# Patient Record
Sex: Female | Born: 1975 | Hispanic: No | Marital: Married | Smoking: Never smoker
Health system: Southern US, Community
[De-identification: ages and names within clinical notes are randomized; demographics above are authoritative.]

## PROBLEM LIST (undated history)

## (undated) DIAGNOSIS — Z789 Other specified health status: Secondary | ICD-10-CM

---

## 2004-04-10 HISTORY — PX: MYOMECTOMY ABDOMINAL APPROACH: SUR870

## 2005-10-27 ENCOUNTER — Other Ambulatory Visit: Admission: RE | Admit: 2005-10-27 | Discharge: 2005-10-27 | Payer: Self-pay | Admitting: Obstetrics and Gynecology

## 2007-01-19 ENCOUNTER — Other Ambulatory Visit: Admission: RE | Admit: 2007-01-19 | Discharge: 2007-01-19 | Payer: Self-pay | Admitting: Obstetrics and Gynecology

## 2009-01-04 ENCOUNTER — Other Ambulatory Visit: Admission: RE | Admit: 2009-01-04 | Discharge: 2009-01-04 | Payer: Self-pay | Admitting: Obstetrics and Gynecology

## 2010-10-23 ENCOUNTER — Other Ambulatory Visit: Payer: Self-pay | Admitting: Family Medicine

## 2010-10-23 DIAGNOSIS — Z1231 Encounter for screening mammogram for malignant neoplasm of breast: Secondary | ICD-10-CM

## 2010-10-25 ENCOUNTER — Ambulatory Visit
Admission: RE | Admit: 2010-10-25 | Discharge: 2010-10-25 | Disposition: A | Discharge status: Home / Self Care | Payer: 59 | Source: Ambulatory Visit | Attending: Family Medicine | Admitting: Family Medicine

## 2010-10-25 DIAGNOSIS — Z1231 Encounter for screening mammogram for malignant neoplasm of breast: Secondary | ICD-10-CM

## 2011-02-20 ENCOUNTER — Other Ambulatory Visit (HOSPITAL_COMMUNITY)
Admission: RE | Admit: 2011-02-20 | Discharge: 2011-02-20 | Disposition: A | Discharge status: Home / Self Care | Payer: 59 | Source: Ambulatory Visit | Attending: Obstetrics and Gynecology | Admitting: Obstetrics and Gynecology

## 2011-02-20 DIAGNOSIS — Z1159 Encounter for screening for other viral diseases: Secondary | ICD-10-CM | POA: Insufficient documentation

## 2011-02-20 DIAGNOSIS — Z01419 Encounter for gynecological examination (general) (routine) without abnormal findings: Secondary | ICD-10-CM | POA: Insufficient documentation

## 2011-06-13 LAB — OB RESULTS CONSOLE ANTIBODY SCREEN: Antibody Screen: NEGATIVE

## 2011-06-13 LAB — OB RESULTS CONSOLE RUBELLA ANTIBODY, IGM: Rubella: IMMUNE

## 2011-12-31 ENCOUNTER — Inpatient Hospital Stay (HOSPITAL_COMMUNITY)
Admission: AD | Admit: 2011-12-31 | Discharge: 2012-01-03 | DRG: 765 | Disposition: A | Discharge status: Home / Self Care | Payer: 59 | Source: Ambulatory Visit | Attending: Obstetrics and Gynecology | Admitting: Obstetrics and Gynecology

## 2011-12-31 ENCOUNTER — Encounter (HOSPITAL_COMMUNITY): Payer: Self-pay | Admitting: *Deleted

## 2011-12-31 ENCOUNTER — Encounter (HOSPITAL_COMMUNITY)
Admission: AD | Disposition: A | Discharge status: Home / Self Care | Payer: Self-pay | Source: Ambulatory Visit | Attending: Obstetrics and Gynecology

## 2011-12-31 ENCOUNTER — Inpatient Hospital Stay (HOSPITAL_COMMUNITY): Payer: 59 | Admitting: Anesthesiology

## 2011-12-31 ENCOUNTER — Encounter (HOSPITAL_COMMUNITY): Payer: Self-pay | Admitting: Anesthesiology

## 2011-12-31 DIAGNOSIS — Z98891 History of uterine scar from previous surgery: Secondary | ICD-10-CM

## 2011-12-31 DIAGNOSIS — O3490 Maternal care for abnormality of pelvic organ, unspecified, unspecified trimester: Principal | ICD-10-CM | POA: Diagnosis present

## 2011-12-31 DIAGNOSIS — O09529 Supervision of elderly multigravida, unspecified trimester: Secondary | ICD-10-CM | POA: Diagnosis present

## 2011-12-31 HISTORY — DX: Other specified health status: Z78.9

## 2011-12-31 LAB — CBC
HCT: 37.3 % (ref 36.0–46.0)
Hemoglobin: 12.9 g/dL (ref 12.0–15.0)
WBC: 11.3 10*3/uL — ABNORMAL HIGH (ref 4.0–10.5)

## 2011-12-31 SURGERY — Surgical Case
Anesthesia: Spinal | Site: Abdomen | Wound class: Clean Contaminated

## 2011-12-31 MED ORDER — DIPHENHYDRAMINE HCL 50 MG/ML IJ SOLN: 25.0000 mg | INTRAMUSCULAR | Status: DC | PRN

## 2011-12-31 MED ORDER — ONDANSETRON HCL 4 MG/2ML IJ SOLN
INTRAMUSCULAR | Status: AC
  Filled 2011-12-31: qty 2

## 2011-12-31 MED ORDER — ZOLPIDEM TARTRATE 5 MG PO TABS: 5.0000 mg | ORAL_TABLET | Freq: Every evening | ORAL | Status: DC | PRN

## 2011-12-31 MED ORDER — MORPHINE SULFATE 0.5 MG/ML IJ SOLN
INTRAMUSCULAR | Status: AC
  Filled 2011-12-31: qty 10

## 2011-12-31 MED ORDER — DIPHENHYDRAMINE HCL 50 MG/ML IJ SOLN
INTRAMUSCULAR | Status: AC
  Filled 2011-12-31: qty 1

## 2011-12-31 MED ORDER — NALBUPHINE HCL 10 MG/ML IJ SOLN
5.0000 mg | INTRAMUSCULAR | Status: DC | PRN
  Filled 2011-12-31 (×2): qty 1

## 2011-12-31 MED ORDER — OXYTOCIN 10 UNIT/ML IJ SOLN
INTRAMUSCULAR | Status: AC
  Filled 2011-12-31: qty 4

## 2011-12-31 MED ORDER — SIMETHICONE 80 MG PO CHEW
80.0000 mg | CHEWABLE_TABLET | Freq: Three times a day (TID) | ORAL | Status: DC
  Administered 2012-01-01 – 2012-01-03 (×6): 80 mg via ORAL

## 2011-12-31 MED ORDER — DIPHENHYDRAMINE HCL 50 MG/ML IJ SOLN
12.5000 mg | INTRAMUSCULAR | Status: DC | PRN
  Administered 2011-12-31: 12.5 mg via INTRAVENOUS

## 2011-12-31 MED ORDER — KETOROLAC TROMETHAMINE 30 MG/ML IJ SOLN: 30.0000 mg | Freq: Four times a day (QID) | INTRAMUSCULAR | Status: AC | PRN

## 2011-12-31 MED ORDER — IBUPROFEN 600 MG PO TABS
600.0000 mg | ORAL_TABLET | Freq: Four times a day (QID) | ORAL | Status: DC
  Administered 2012-01-01 – 2012-01-03 (×10): 600 mg via ORAL
  Filled 2011-12-31 (×10): qty 1

## 2011-12-31 MED ORDER — METHYLERGONOVINE MALEATE 0.2 MG PO TABS: 0.2000 mg | ORAL_TABLET | ORAL | Status: DC | PRN

## 2011-12-31 MED ORDER — NALOXONE HCL 0.4 MG/ML IJ SOLN: 0.4000 mg | INTRAMUSCULAR | Status: DC | PRN

## 2011-12-31 MED ORDER — PHENYLEPHRINE HCL 10 MG/ML IJ SOLN
INTRAMUSCULAR | Status: DC | PRN
  Administered 2011-12-31: 120 ug via INTRAVENOUS
  Administered 2011-12-31 (×2): 40 ug via INTRAVENOUS
  Administered 2011-12-31: 80 ug via INTRAVENOUS

## 2011-12-31 MED ORDER — MIDAZOLAM HCL 2 MG/2ML IJ SOLN
INTRAMUSCULAR | Status: AC
  Filled 2011-12-31: qty 2

## 2011-12-31 MED ORDER — ONDANSETRON HCL 4 MG/2ML IJ SOLN: 4.0000 mg | Freq: Three times a day (TID) | INTRAMUSCULAR | Status: DC | PRN

## 2011-12-31 MED ORDER — IBUPROFEN 600 MG PO TABS: 600.0000 mg | ORAL_TABLET | Freq: Four times a day (QID) | ORAL | Status: DC | PRN

## 2011-12-31 MED ORDER — ONDANSETRON HCL 4 MG/2ML IJ SOLN: 4.0000 mg | INTRAMUSCULAR | Status: DC | PRN

## 2011-12-31 MED ORDER — PRENATAL MULTIVITAMIN CH: 1.0000 | ORAL_TABLET | Freq: Every day | ORAL | Status: DC

## 2011-12-31 MED ORDER — HYDROMORPHONE HCL PF 1 MG/ML IJ SOLN: 0.2500 mg | INTRAMUSCULAR | Status: DC | PRN

## 2011-12-31 MED ORDER — PHENYLEPHRINE 40 MCG/ML (10ML) SYRINGE FOR IV PUSH (FOR BLOOD PRESSURE SUPPORT)
PREFILLED_SYRINGE | INTRAVENOUS | Status: AC
  Filled 2011-12-31: qty 10

## 2011-12-31 MED ORDER — CITRIC ACID-SODIUM CITRATE 334-500 MG/5ML PO SOLN
30.0000 mL | Freq: Once | ORAL | Status: AC
  Administered 2011-12-31: 30 mL via ORAL
  Filled 2011-12-31: qty 15

## 2011-12-31 MED ORDER — CEFAZOLIN SODIUM-DEXTROSE 2-3 GM-% IV SOLR
2.0000 g | INTRAVENOUS | Status: AC
  Administered 2011-12-31: 2 g via INTRAVENOUS
  Filled 2011-12-31: qty 50

## 2011-12-31 MED ORDER — MEPERIDINE HCL 25 MG/ML IJ SOLN: 6.2500 mg | INTRAMUSCULAR | Status: DC | PRN

## 2011-12-31 MED ORDER — SODIUM CHLORIDE 0.9 % IJ SOLN: 3.0000 mL | INTRAMUSCULAR | Status: DC | PRN

## 2011-12-31 MED ORDER — OXYTOCIN 40 UNITS IN LACTATED RINGERS INFUSION - SIMPLE MED: 62.5000 mL/h | INTRAVENOUS | Status: AC

## 2011-12-31 MED ORDER — ONDANSETRON HCL 4 MG PO TABS: 4.0000 mg | ORAL_TABLET | ORAL | Status: DC | PRN

## 2011-12-31 MED ORDER — FENTANYL CITRATE 0.05 MG/ML IJ SOLN
INTRAMUSCULAR | Status: DC | PRN
  Administered 2011-12-31: 25 ug via INTRATHECAL

## 2011-12-31 MED ORDER — METOCLOPRAMIDE HCL 5 MG/ML IJ SOLN
10.0000 mg | Freq: Three times a day (TID) | INTRAMUSCULAR | Status: DC | PRN
  Administered 2011-12-31: 10 mg via INTRAVENOUS

## 2011-12-31 MED ORDER — SCOPOLAMINE 1 MG/3DAYS TD PT72
1.0000 | MEDICATED_PATCH | Freq: Once | TRANSDERMAL | Status: DC
  Administered 2011-12-31: 1.5 mg via TRANSDERMAL

## 2011-12-31 MED ORDER — LACTATED RINGERS IV SOLN
INTRAVENOUS | Status: DC
  Administered 2011-12-31 (×3): via INTRAVENOUS

## 2011-12-31 MED ORDER — FAMOTIDINE IN NACL 20-0.9 MG/50ML-% IV SOLN
20.0000 mg | Freq: Once | INTRAVENOUS | Status: AC
  Administered 2011-12-31: 20 mg via INTRAVENOUS
  Filled 2011-12-31: qty 50

## 2011-12-31 MED ORDER — PRENATAL MULTIVITAMIN CH
1.0000 | ORAL_TABLET | Freq: Every morning | ORAL | Status: DC
  Administered 2012-01-01 – 2012-01-03 (×3): 1 via ORAL
  Filled 2011-12-31 (×3): qty 1

## 2011-12-31 MED ORDER — SIMETHICONE 80 MG PO CHEW: 80.0000 mg | CHEWABLE_TABLET | ORAL | Status: DC | PRN

## 2011-12-31 MED ORDER — SODIUM CHLORIDE 0.9 % IV SOLN: 1.0000 ug/kg/h | INTRAVENOUS | Status: DC | PRN

## 2011-12-31 MED ORDER — WITCH HAZEL-GLYCERIN EX PADS: 1.0000 "application " | MEDICATED_PAD | CUTANEOUS | Status: DC | PRN

## 2011-12-31 MED ORDER — KETOROLAC TROMETHAMINE 60 MG/2ML IM SOLN
INTRAMUSCULAR | Status: AC
  Filled 2011-12-31: qty 2

## 2011-12-31 MED ORDER — SENNOSIDES-DOCUSATE SODIUM 8.6-50 MG PO TABS
2.0000 | ORAL_TABLET | Freq: Every day | ORAL | Status: DC
  Administered 2012-01-01: 2 via ORAL

## 2011-12-31 MED ORDER — MENTHOL 3 MG MT LOZG: 1.0000 | LOZENGE | OROMUCOSAL | Status: DC | PRN

## 2011-12-31 MED ORDER — NALBUPHINE HCL 10 MG/ML IJ SOLN
5.0000 mg | INTRAMUSCULAR | Status: DC | PRN
  Administered 2011-12-31: 5 mg via INTRAVENOUS
  Administered 2012-01-01: 10 mg via INTRAVENOUS
  Filled 2011-12-31: qty 1

## 2011-12-31 MED ORDER — OXYCODONE-ACETAMINOPHEN 5-325 MG PO TABS
1.0000 | ORAL_TABLET | ORAL | Status: DC | PRN
  Administered 2012-01-01: 1 via ORAL
  Filled 2011-12-31: qty 1

## 2011-12-31 MED ORDER — METOCLOPRAMIDE HCL 5 MG/ML IJ SOLN
INTRAMUSCULAR | Status: AC
  Filled 2011-12-31: qty 2

## 2011-12-31 MED ORDER — DIBUCAINE 1 % RE OINT: 1.0000 "application " | TOPICAL_OINTMENT | RECTAL | Status: DC | PRN

## 2011-12-31 MED ORDER — LANOLIN HYDROUS EX OINT: 1.0000 "application " | TOPICAL_OINTMENT | CUTANEOUS | Status: DC | PRN

## 2011-12-31 MED ORDER — MORPHINE SULFATE (PF) 0.5 MG/ML IJ SOLN
INTRAMUSCULAR | Status: DC | PRN
  Administered 2011-12-31: .15 ug via INTRATHECAL

## 2011-12-31 MED ORDER — DIPHENHYDRAMINE HCL 25 MG PO CAPS
25.0000 mg | ORAL_CAPSULE | ORAL | Status: DC | PRN
  Filled 2011-12-31: qty 1

## 2011-12-31 MED ORDER — SCOPOLAMINE 1 MG/3DAYS TD PT72
MEDICATED_PATCH | TRANSDERMAL | Status: AC
  Filled 2011-12-31: qty 1

## 2011-12-31 MED ORDER — ONDANSETRON HCL 4 MG/2ML IJ SOLN
INTRAMUSCULAR | Status: DC | PRN
  Administered 2011-12-31: 4 mg via INTRAVENOUS

## 2011-12-31 MED ORDER — METHYLERGONOVINE MALEATE 0.2 MG/ML IJ SOLN: 0.2000 mg | INTRAMUSCULAR | Status: DC | PRN

## 2011-12-31 MED ORDER — NALBUPHINE SYRINGE 5 MG/0.5 ML
INJECTION | INTRAMUSCULAR | Status: AC
  Administered 2012-01-01: 10 mg via INTRAVENOUS
  Filled 2011-12-31: qty 0.5

## 2011-12-31 MED ORDER — FENTANYL CITRATE 0.05 MG/ML IJ SOLN
INTRAMUSCULAR | Status: AC
  Filled 2011-12-31: qty 4

## 2011-12-31 MED ORDER — DIPHENHYDRAMINE HCL 25 MG PO CAPS: 25.0000 mg | ORAL_CAPSULE | Freq: Four times a day (QID) | ORAL | Status: DC | PRN

## 2011-12-31 MED ORDER — LACTATED RINGERS IV SOLN
INTRAVENOUS | Status: DC
  Administered 2011-12-31: 23:00:00 via INTRAVENOUS

## 2011-12-31 MED ORDER — FERROUS SULFATE 325 (65 FE) MG PO TABS
325.0000 mg | ORAL_TABLET | Freq: Two times a day (BID) | ORAL | Status: DC
  Administered 2012-01-01 – 2012-01-03 (×5): 325 mg via ORAL
  Filled 2011-12-31 (×5): qty 1

## 2011-12-31 MED ORDER — KETOROLAC TROMETHAMINE 60 MG/2ML IM SOLN
60.0000 mg | Freq: Once | INTRAMUSCULAR | Status: AC | PRN
  Administered 2011-12-31: 60 mg via INTRAMUSCULAR

## 2011-12-31 SURGICAL SUPPLY — 34 items
APL SKNCLS STERI-STRIP NONHPOA (GAUZE/BANDAGES/DRESSINGS) ×1
BENZOIN TINCTURE PRP APPL 2/3 (GAUZE/BANDAGES/DRESSINGS) ×2 IMPLANT
CLOTH BEACON ORANGE TIMEOUT ST (SAFETY) ×2 IMPLANT
DRAPE SURG 17X23 STRL (DRAPES) ×2 IMPLANT
DRESSING TELFA 8X3 (GAUZE/BANDAGES/DRESSINGS) ×2 IMPLANT
DRSG COVADERM 4X10 (GAUZE/BANDAGES/DRESSINGS) IMPLANT
DURAPREP 26ML APPLICATOR (WOUND CARE) ×2 IMPLANT
ELECT REM PT RETURN 9FT ADLT (ELECTROSURGICAL) ×2
ELECTRODE REM PT RTRN 9FT ADLT (ELECTROSURGICAL) ×1 IMPLANT
GAUZE SPONGE 4X4 12PLY STRL LF (GAUZE/BANDAGES/DRESSINGS) ×2 IMPLANT
GLOVE BIOGEL M 6.5 STRL (GLOVE) ×4 IMPLANT
GLOVE BIOGEL PI IND STRL 6.5 (GLOVE) ×2 IMPLANT
GLOVE BIOGEL PI INDICATOR 6.5 (GLOVE) ×2
GOWN PREVENTION PLUS LG XLONG (DISPOSABLE) ×4 IMPLANT
GOWN PREVENTION PLUS XLARGE (GOWN DISPOSABLE) ×2 IMPLANT
NS IRRIG 1000ML POUR BTL (IV SOLUTION) ×2 IMPLANT
PACK C SECTION WH (CUSTOM PROCEDURE TRAY) ×2 IMPLANT
PAD ABD 7.5X8 STRL (GAUZE/BANDAGES/DRESSINGS) ×2 IMPLANT
PAD OB MATERNITY 4.3X12.25 (PERSONAL CARE ITEMS) IMPLANT
RTRCTR C-SECT PINK 25CM LRG (MISCELLANEOUS) ×2 IMPLANT
SLEEVE SCD COMPRESS KNEE MED (MISCELLANEOUS) ×2 IMPLANT
SUT PDS AB 0 CT1 27 (SUTURE) ×4 IMPLANT
SUT PLAIN 0 NONE (SUTURE) IMPLANT
SUT VIC AB 0 CTX 36 (SUTURE) ×8
SUT VIC AB 0 CTX36XBRD ANBCTRL (SUTURE) ×4 IMPLANT
SUT VIC AB 2-0 CT1 27 (SUTURE) ×4
SUT VIC AB 2-0 CT1 TAPERPNT 27 (SUTURE) ×2 IMPLANT
SUT VIC AB 3-0 SH 27 (SUTURE)
SUT VIC AB 3-0 SH 27X BRD (SUTURE) IMPLANT
SUT VIC AB 4-0 KS 27 (SUTURE) ×4 IMPLANT
TAPE CLOTH SURG 4X10 WHT LF (GAUZE/BANDAGES/DRESSINGS) ×2 IMPLANT
TOWEL OR 17X24 6PK STRL BLUE (TOWEL DISPOSABLE) ×4 IMPLANT
TRAY FOLEY CATH 14FR (SET/KITS/TRAYS/PACK) ×2 IMPLANT
WATER STERILE IRR 1000ML POUR (IV SOLUTION) ×2 IMPLANT

## 2011-12-31 NOTE — Anesthesia Postprocedure Evaluation (Signed)
Anesthesia Post Note  Patient: Andrea Patel  Procedure(s) Performed: Procedure(s) (LRB): CESAREAN SECTION (N/A)  Anesthesia type: Spinal  Patient location: PACU  Post pain: Pain level controlled  Post assessment: Post-op Vital signs reviewed  Last Vitals:  Filed Vitals:   12/31/11 2000  BP: 87/60  Pulse: 75  Temp:   Resp: 23    Post vital signs: Reviewed  Level of consciousness: awake  Complications: No apparent anesthesia complications

## 2011-12-31 NOTE — OR Nursing (Signed)
Placenta to OR Refrigerator. 

## 2011-12-31 NOTE — Anesthesia Procedure Notes (Signed)

## 2011-12-31 NOTE — Anesthesia Preprocedure Evaluation (Addendum)
Anesthesia Evaluation  Patient identified by MRN, date of birth, ID band Patient awake    Reviewed: Allergy & Precautions, H&P , Patient's Chart, lab work & pertinent test results  Airway Mallampati: II TM Distance: >3 FB Neck ROM: full    Dental No notable dental hx.    Pulmonary  breath sounds clear to auscultation  Pulmonary exam normal       Cardiovascular Exercise Tolerance: Good Rhythm:regular Rate:Normal     Neuro/Psych    GI/Hepatic GERD-  ,  Endo/Other    Renal/GU      Musculoskeletal   Abdominal   Peds  Hematology   Anesthesia Other Findings   Reproductive/Obstetrics                           Anesthesia Physical Anesthesia Plan  ASA: II  Anesthesia Plan: Spinal   Post-op Pain Management:    Induction:   Airway Management Planned:   Additional Equipment:   Intra-op Plan:   Post-operative Plan:   Informed Consent: I have reviewed the patients History and Physical, chart, labs and discussed the procedure including the risks, benefits and alternatives for the proposed anesthesia with the patient or authorized representative who has indicated his/her understanding and acceptance.   Dental Advisory Given  Plan Discussed with: CRNA  Anesthesia Plan Comments: (Lab work confirmed with CRNA in room. Platelets okay. Discussed spinal anesthetic, and patient consents to the procedure:  included risk of possible headache,backache, failed block, allergic reaction, and nerve injury. This patient was asked if she had any questions or concerns before the procedure started. )        Anesthesia Quick Evaluation  

## 2011-12-31 NOTE — Progress Notes (Signed)
To OR via stretcher

## 2011-12-31 NOTE — H&P (Signed)
Andrea Patel is a 36 y.o. female presenting at 36 wks and 5 days  Based on lmp 04/18/2011 with EDD 01/23/2012. Pregnancy is complicated by h/o preterm delivery  At 5 and 6 days  And h/o laparoscopic myomectomy. Pt reports rupture of clear fluid at 130 this afternoon with onset of contraction q 3-5 minutes. + FM no vaginal bleeding   History OB History    Grav Para Term Preterm Abortions TAB SAB Ect Mult Living   3 2  2      1      Past Medical History  Diagnosis Date  . No pertinent past medical history    Past Surgical History  Procedure Date  . Myomectomy abdominal approach 04/2004   Family History: family history is negative for Other. Social History:  reports that she has never smoked. She does not have any smokeless tobacco history on file. She reports that she does not drink alcohol or use illicit drugs.   Prenatal Transfer Tool  Maternal Diabetes: No Genetic Screening: Normal Maternal Ultrasounds/Referrals: Normal Fetal Ultrasounds or other Referrals:  None Maternal Substance Abuse:  No Significant Maternal Medications:  Meds include: Other:  17 hydroxy progesterone shots  Significant Maternal Lab Results:  Lab values include: Group B Strep negative Other Comments:  None  Review of Systems  All other systems reviewed and are negative.    Dilation: 4 Exam by:: Dr Richardson Dopp Blood pressure 124/82, pulse 98, temperature 98.7 F (37.1 C), temperature source Oral, resp. rate 20, height 5\' 5"  (1.651 m), weight 80.74 kg (178 lb), last menstrual period 04/15/2011, unknown if currently breastfeeding. Maternal Exam:  Uterine Assessment: Contraction strength is moderate.  Contraction duration is 60 seconds. Contraction frequency is regular.   Abdomen: Patient reports no abdominal tenderness. Surgical scars: laparoscopic scar.   Fundal height is 36.   Estimated fetal weight is 5 lbs .   Fetal presentation: vertex  Introitus: Normal vulva. Normal vagina.  Ferning test:  positive.  Nitrazine test: positive. Amniotic fluid character: clear.  Pelvis: adequate for delivery.   Cervix: Cervix evaluated by digital exam.     Fetal Exam Fetal Monitor Review: Mode: fetoscope.   Baseline rate: 140.  Variability: moderate (6-25 bpm).   Pattern: accelerations present and no decelerations.    Fetal State Assessment: Category I - tracings are normal.     Physical Exam  Constitutional: She is oriented to person, place, and time. She appears well-developed and well-nourished.  Neck: Normal range of motion.  Cardiovascular: Normal rate and regular rhythm.   Respiratory: Effort normal and breath sounds normal.  Genitourinary: Vagina normal.  Neurological: She is alert and oriented to person, place, and time.  Psychiatric: She has a normal mood and affect.    Prenatal labs: ABO, Rh:  A positive   Antibody:  Negative  Rubella:  Immune   RPR:   Non reactive  HBsAg:   Negative  HIV:   Non reactive  GBS:   Negative   Assessment/Plan: 36 wks and 5 days with h/o myomectomy in active labor with prom. D/w pt r/o cesarean section vs VBAC. Pt desires to proceed with cesarean section. Risk discussed including but not limited to infection bleeding damage to bowel bladder and baby with the need for further surgery. R/o transfusion HIV Hep B&C discussed. Pt voiced understanding and desires to proceed with cesarean section. She excepts r/o transfusion if needed    Tmya Patel J. 12/31/2011, 5:39 PM

## 2011-12-31 NOTE — Progress Notes (Signed)
Dr Richardson Dopp discussing  C/S vs svd with pt. And spouse. Pt will have C/S

## 2011-12-31 NOTE — Progress Notes (Signed)
To OR

## 2011-12-31 NOTE — Transfer of Care (Signed)
Immediate Anesthesia Transfer of Care Note  Patient: Andrea Patel  Procedure(s) Performed: Procedure(s) (LRB) with comments: CESAREAN SECTION (N/A)  Patient Location: PACU  Anesthesia Type: Spinal  Level of Consciousness: awake, alert  and oriented  Airway & Oxygen Therapy: Patient Spontanous Breathing  Post-op Assessment: Report given to PACU RN  Post vital signs: Reviewed and stable  Complications: No apparent anesthesia complications

## 2011-12-31 NOTE — MAU Note (Signed)
Went to [facility] about 1500 and urinated. Went back to work and felt a gush of fld. Cont. To leak cl fld. Having some contractions

## 2011-12-31 NOTE — Op Note (Signed)
Cesarean Section Procedure Note  Indications: previous myomectomy  Pre-operative Diagnosis: 36 week 5 day pregnancy.  Post-operative Diagnosis: same  Surgeon: Jessee Avers. M.D.  Assistants: Dr. Geryl Rankins   Anesthesia: Spinal anesthesia  ASA Class: 2   Procedure Details   The patient was seen in the Holding Room. The risks, benefits, complications, treatment options, and expected outcomes were discussed with the patient.  The patient concurred with the proposed plan, giving informed consent.  The site of surgery properly noted/marked. The patient was taken to Operating Room # 1, identified as Physicist, medical and the procedure verified as C-Section Delivery. A Time Out was held and the above information confirmed.  After induction of anesthesia, the patient was draped and prepped in the usual sterile manner. A Pfannenstiel incision was made and carried down through the subcutaneous tissue to the fascia. Fascial incision was made and extended transversely. The fascia was separated from the underlying rectus tissue superiorly and inferiorly. The peritoneum was identified and entered. Peritoneal incision was extended longitudinally. The utero-vesical peritoneal reflection was incised transversely and the bladder flap was bluntly freed from the lower uterine segment. A low transverse uterine incision was made. Delivered from cephalic presentation was a Female with Apgar scores of 9 at one minute and 9 at five minutes. After the umbilical cord was clamped and cut cord blood was obtained for evaluation. The placenta was removed intact and appeared normal. The uterine outline, tubes and ovaries appeared normal. The uterine incision was closed with running locked sutures of 0 vicryl a second layer of 0 vicryl was used for imbrication . Hemostasis was observed. Lavage was carried out until clear.  Peritoneum was reapproximated with 2-0 vicryl The fascia was then reapproximated with running sutures  of 0 pds. The skin was reapproximated with 4-0 vicryl .  Instrument, sponge, and needle counts were correct prior the abdominal closure and at the conclusion of the case.   Findings: Female infant in cephalic presentation   Estimated Blood Loss:  700 ml    UOP 200 ml          Drains: Foley catheter          Total IV Fluids:  Per anesthesia          Specimens: Placenta and  to labor and delivery            Implants: none         Complications:  None; patient tolerated the procedure well.         Disposition: PACU - hemodynamically stable.         Condition: stable  Attending Attestation: I performed the procedure.

## 2012-01-01 ENCOUNTER — Encounter (HOSPITAL_COMMUNITY): Payer: Self-pay | Admitting: Anesthesiology

## 2012-01-01 ENCOUNTER — Encounter (HOSPITAL_COMMUNITY): Payer: Self-pay | Admitting: *Deleted

## 2012-01-01 LAB — CBC
HCT: 32.3 % — ABNORMAL LOW (ref 36.0–46.0)
MCV: 74.1 fL — ABNORMAL LOW (ref 78.0–100.0)
RBC: 4.36 MIL/uL (ref 3.87–5.11)
WBC: 17.8 10*3/uL — ABNORMAL HIGH (ref 4.0–10.5)

## 2012-01-01 NOTE — Progress Notes (Signed)
Subjective: Postpartum Day 1: Cesarean Delivery Patient reports pain is well controlled.  Bleeding WNL.  +Breast feeding, doing well.     Objective: Vital signs in last 24 hours: Temp:  [97.7 F (36.5 C)-99.8 F (37.7 C)] 98 F (36.7 C) (10/24 0800) Pulse Rate:  [68-98] 74  (10/24 0800) Resp:  [15-24] 18  (10/24 0800) BP: (81-127)/(47-82) 108/59 mmHg (10/24 0800) SpO2:  [94 %-100 %] 98 % (10/24 0800) Weight:  [80.74 kg (178 lb)] 80.74 kg (178 lb) (10/23 1648)  Physical Exam:  General: alert, cooperative and no distress Lochia: Not assessed at perineum. Uterine Fundus: firm, normally tender Incision: Dressing clean and dry. DVT Evaluation: No evidence of DVT seen on physical exam. SCDs on.   Basename 01/01/12 0535 12/31/11 1747  HGB 10.8* 12.9  HCT 32.3* 37.3    Assessment/Plan: Status post Cesarean section. Doing well postoperatively.  Continue current care. Lactation support. Encouraged ambulation in halls TID. Circ to be done by Dr. Richardson Dopp. Kylie Gros 01/01/2012, 2:10 PM

## 2012-01-01 NOTE — Anesthesia Postprocedure Evaluation (Signed)
  Anesthesia Post-op Note  Patient: Andrea Patel  Procedure(s) Performed: Procedure(s) (LRB) with comments: CESAREAN SECTION WITH BILATERAL TUBAL LIGATION (Bilateral) - Primary  Patient Location: PACU and Mother/Baby  Anesthesia Type: Spinal  Level of Consciousness: awake, alert  and oriented  Airway and Oxygen Therapy: Patient Spontanous Breathing  Post-op Pain: mild  Post-op Assessment: Patient's Cardiovascular Status Stable, Respiratory Function Stable, No signs of Nausea or vomiting and Pain level controlled  Post-op Vital Signs: stable  Complications: No apparent anesthesia complications

## 2012-01-02 MED ORDER — CALCIUM CARBONATE ANTACID 500 MG PO CHEW
1.0000 | CHEWABLE_TABLET | Freq: Three times a day (TID) | ORAL | Status: DC | PRN
  Administered 2012-01-02: 200 mg via ORAL
  Filled 2012-01-02: qty 1

## 2012-01-02 NOTE — Progress Notes (Signed)
Post Partum Day 2 s/p cesarean section due to h/o myomectomy  Subjective: no complaints, up ad lib, voiding and tolerating PO  Objective: Blood pressure 118/71, pulse 79, temperature 98.3 F (36.8 C), temperature source Oral, resp. rate 18, height 5\' 5"  (1.651 m), weight 80.74 kg (178 lb), last menstrual period 04/15/2011, SpO2 97.00%, unknown if currently breastfeeding.  Physical Exam:  General: alert and cooperative Lochia: appropriate Uterine Fundus: firm Incision: healing well DVT Evaluation: No evidence of DVT seen on physical exam.   Basename 01/01/12 0535 12/31/11 1747  HGB 10.8* 12.9  HCT 32.3* 37.3    Assessment/Plan: Plan for discharge tomorrow, Circumcision prior to discharge and Contraception  husband planning on vasectomy    LOS: 2 days   Andrea Patel. 01/02/2012, 8:11 AM

## 2012-01-03 ENCOUNTER — Encounter (HOSPITAL_COMMUNITY)
Admission: RE | Admit: 2012-01-03 | Discharge: 2012-01-03 | Disposition: A | Discharge status: Home / Self Care | Payer: 59 | Source: Ambulatory Visit | Attending: Obstetrics and Gynecology | Admitting: Obstetrics and Gynecology

## 2012-01-03 DIAGNOSIS — O923 Agalactia: Secondary | ICD-10-CM | POA: Insufficient documentation

## 2012-01-03 NOTE — Progress Notes (Signed)
Post Partum Day 3 s/p CS Subjective: no complaints, tolerating PO, + flatus and had bowel movement  Objective: Blood pressure 126/82, pulse 86, temperature 97.5 F (36.4 C), temperature source Oral, resp. rate 18, height 5\' 5"  (1.651 m), weight 80.74 kg (178 lb), last menstrual period 04/15/2011, SpO2 97.00%, unknown if currently breastfeeding.  Physical Exam:  General: alert, cooperative and no distress Lochia: appropriate Uterine Fundus: firm Episiotomy, laceration : na DVT Evaluation: No evidence of DVT seen on physical exam.   Basename 01/01/12 0535 12/31/11 1747  HGB 10.8* 12.9  HCT 32.3* 37.3    Assessment/Plan: Discharge home   LOS: 3 days   Kyrollos Cordell E 01/03/2012, 11:24 AM

## 2012-01-03 NOTE — Discharge Summary (Signed)
Obstetric Discharge Summary Reason for Admission: cesarean section Prenatal Procedures: none Intrapartum Procedures: cesarean: low cervical, transverse Postpartum Procedures: none Complications-Operative and Postpartum: none  Hemoglobin  Date Value Range Status  01/01/2012 10.8* 12.0 - 15.0 g/dL Final     HCT  Date Value Range Status  01/01/2012 32.3* 36.0 - 46.0 % Final    Discharge Diagnoses: Term Pregnancy-delivered  Discharge Information: Date: 01/03/2012 Activity: pelvic rest Diet: routine Medications: PNV and Ibuprofen (Motrin 800mg  po tid as needed)  rx to patient. Condition: stable Instructions: refer to practice specific booklet Discharge to: home Follow-up Information    Follow up with Jessee Avers., MD. Schedule an appointment as soon as possible for a visit in 2 weeks. (incision check )    Contact information:   [address] [place] [address] [phone]          Newborn Data: Live born  Information for the patient's newborn:  [name], [name] [[id number]]  female ; APGAR , ; weight ;  Home with mother.  Andrea Patel E 01/03/2012, 11:30 AM

## 2012-01-04 LAB — TYPE AND SCREEN
Antibody Screen: POSITIVE
Unit division: 0

## 2012-01-12 ENCOUNTER — Inpatient Hospital Stay (HOSPITAL_COMMUNITY): Admission: RE | Admit: 2012-01-12 | Payer: 59 | Source: Ambulatory Visit | Admitting: Obstetrics and Gynecology

## 2012-01-12 ENCOUNTER — Encounter (HOSPITAL_COMMUNITY): Admission: RE | Payer: Self-pay | Source: Ambulatory Visit

## 2012-01-12 SURGERY — Surgical Case
Anesthesia: Regional | Laterality: Bilateral

## 2012-02-03 ENCOUNTER — Encounter (HOSPITAL_COMMUNITY)
Admission: RE | Admit: 2012-02-03 | Discharge: 2012-02-03 | Disposition: A | Discharge status: Home / Self Care | Payer: 59 | Source: Ambulatory Visit | Attending: Obstetrics and Gynecology | Admitting: Obstetrics and Gynecology

## 2012-02-03 DIAGNOSIS — O923 Agalactia: Secondary | ICD-10-CM | POA: Insufficient documentation

## 2012-02-04 ENCOUNTER — Ambulatory Visit (HOSPITAL_COMMUNITY): Payer: 59

## 2012-02-10 ENCOUNTER — Ambulatory Visit (HOSPITAL_COMMUNITY)
Admission: RE | Admit: 2012-02-10 | Discharge: 2012-02-10 | Disposition: A | Discharge status: Home / Self Care | Payer: 59 | Source: Ambulatory Visit | Attending: Obstetrics and Gynecology | Admitting: Obstetrics and Gynecology

## 2012-02-10 NOTE — Progress Notes (Signed)
Adult Lactation Consultation Outpatient Visit Note  Patient Name: Andrea Patel                            Baby Boy Andrea Patel DOB [birth year] Now 27 weeks old, Birth Weight                                                                                       5 lb. 15 oz.  Date of Birth: [birth year] Gestational Age at Delivery: Unknown Type of Delivery: C/S  Breastfeeding History: Frequency of Breastfeeding: 3-4 times per day,  Length of Feeding: On average 15 minutes each breast, but sometimes 5 minutes per breast Voids: 6 per day Stools: 1 per day, yellow liquid  Supplementing / Method: Pumping:  Type of Pump:  [employer]   Frequency:  4-5 times/day, average for 20 minutes  Volume:  1 1/2 oz combined from both breasts  Comments: Mom is here for feeding assessment. She has been pumping and supplementing with EBM or formula since right after birth using bottle with slow flow nipple. About a week after delivery she developed a UTI and did not pump for that week. She has not been able to build up her milk supply. She has been drinking Mother's Milk Tea and Fenugreek Tea. She would like the baby to exclusively breastfeed, but reports he is fussy at the breast and at times will not maintain a latch.    Consultation Evaluation: On exam it is noted that Andrea Patel has a short anterior frenulum. He does bring his tongue forward just past the gum line. With suck exam he chews, then will develop a sucking rhythm for few sucks, then chews again. Mom has large breasts with a short nipple shaft. Mom latches Andrea Patel well in football and cross cradle, but it is noted that he will slip towards the end of her nipple and there is some compression noted on the nipple when you take him off the breast. When he slips on the nipple there is an audible clicking sound. Andrea Patel also seems frustrated with the flow, he pulls on and off the breast after suckling for a few minutes. Demonstrated to Mom how to compress  her aerola to obtain more depth with the latch. This does help but over time Tonita Cong needs adjusting due to him starting to pull on and off the breast. Initiated a SNS to supplement, after several attempts and adjustments, Tonita Cong did take approx 30 ml at the breast with the SNS. He became frustrated so Mom finished the feeding with a bottle and slow flow nipple. Mom used a nipple shield after Andrea Patel was born, but she stopped because she reports it was difficult to use. We did not initiate a nipple shield at this visit. Discussed with Mom the importance of a deep latch to milk transfer and milk supply. Encouraged to observe breasts with breastfeeding and when Andrea Patel appears to be slipping to re-latch. Discussed with Mom that the short frenulum could be contributing to [place] having difficulty maintaining depth with the latch. This along with her short nipple shafts. Discussed with Mom that  his bottle use may be contributing to his chewing motion at the breast and his frustration with the flow of milk from her breast.   Initial Feeding Assessment: Pre-feed Weight:   8 lb. 10.5 oz/ 3926 gm Post-feed Weight:  8 lb. 11.2 oz/3946 gm Amount Transferred:  20 ml Comments:  From left breast with nursing 17 minutes  Additional Feeding Assessment: Pre-feed Weight:  8 lb. 11.2 oz/ 3946 gm Post-feed Weight:  8 lb. 11.4 oz/ 3952 gm Amount Transferred:  6 ml Comments:  From right breast with nursing 11 min.  Additional Feeding Assessment: Pre-feed Weight:   8 lb. 11.4 oz/3952 gm Post-feed Weight:  8 lb. 13.7 oz/ 4016 gm Amount Transferred:  64 ml Comments:  Nursing on right breast with SNS and formula, baby took 30 ml of formula and transferred another 4 ml of breast milk. Then Mom gave Andrea Patel the remaining 30 ml of formula with bottle and slow flow nipple.  Total Breast milk Transferred this Visit: 30 ml Total Supplement Given: 60 ml.  Additional Interventions: Encouraged Mom to breast  feed more often during the day and evening before giving supplement, keep baby active at the breast for 15-20 minutes or more.  Use the SNS to supplement during the day, if it is easier use the bottle at night. When using the SNS, breast feed without the SNS on the 1st breast, use the SNS on the second breast with EBM or formula, 60-90 ml. Taught Mom how to perform suck training with the bottle to improve Baby Jeremiah's suck. Encouraged Mom to consider an evaluation by Speech therapist due to short frenulum and for evaluation of suck training. Information given to Mom to contact Speech therapist. Information given of supplements in increase milk production. Recommended Mother Milk Plus, or More Milk Special Blend from [facility]. Advised mom to post pump during the day for 15-20 minutes. Consider power pumping once in the am/pm.   Follow-Up  OP follow up scheduled for 02/19/12 at 9:00    Alfred Levins 02/10/2012, 2:06 PM

## 2012-02-19 ENCOUNTER — Ambulatory Visit (HOSPITAL_COMMUNITY)
Admission: RE | Admit: 2012-02-19 | Discharge: 2012-02-19 | Disposition: A | Discharge status: Home / Self Care | Payer: 59 | Source: Ambulatory Visit | Attending: Obstetrics and Gynecology | Admitting: Obstetrics and Gynecology

## 2012-02-19 NOTE — Progress Notes (Signed)
Adult Lactation Consultation Outpatient Visit Note  Patient Name: Andrea Patel(mother)   BABY: [name] Date of Birth: [birth year]                                      DOB: [birth year] Gestational Age at Delivery: 87.5                      BIRTH WEIGHT: 5-15 Type of Delivery: C/S                                         WEIGHT TODAY: 9-11  Breastfeeding History: Frequency of Breastfeeding: 5 TIMES/24 HOURS Length of Feeding: 3-20 MIN Voids: QS  Stools: QS  Supplementing / Method: EBM/FORMULA  2 OZ EVERY 3 HOURS PER BOTTLE Pumping:  Type of Pump:SYMPHONY   Frequency:5 TIMES PER DAY  Volume:  30-45 MLS  Comments:    Consultation Evaluation: Mom and 80 week old baby here for follow up appointment to assess low milk supply and feeding difficulties.  Baby has gained 17 oz/9 days.  Mom states breastfeeding has not improved since last visit.  Baby has an appointment to see a [facility] speech pathologist after lactation appointment to assess suck ability and possible tight frenulum.  Baby latched on easily in office and mom shown how to compress breast tissue to obtain deeper latch.  Baby did a little chewing and pulling from breast but for the majority of a 30 minute feed sucked with rhythmic pattern and few audible swallows.  Encouraged mom to use good breast massage/compressions during feeding to increase milk flow.  Baby also latched to other breast without difficulty for 10 minutes.  24 mm nipple shield placed towards the end of feeding to see if baby would do less pulling but not significant improvement noted.  Mom is not interested in using the SNS at this time.  She is taking mothers love herbal supplement.  Patient has no history of depression and may call her OB MD to discuss reglan for milk supply.  Baby only transferred 16 mls at his feeding.  Initial Feeding Assessment:20 minutes left/10 minutes right Pre-feed [id number] Post-feed Weight:4410 Amount Transferred:16  mls Comments:  Additional Feeding Assessment: Pre-feed Weight: Post-feed Weight: Amount Transferred: Comments:  Additional Feeding Assessment: Pre-feed Weight: Post-feed Weight: Amount Transferred: Comments:  Total Breast milk Transferred this Visit: 16 mls Total Supplement Given: 2 oz formula  Additional Interventions:   Follow-Up  Mom will call [facility] office for concerns or desired follow up appointment      Hansel Feinstein 02/19/2012, 3:12 PM

## 2012-03-04 ENCOUNTER — Encounter (HOSPITAL_COMMUNITY)
Admission: RE | Admit: 2012-03-04 | Discharge: 2012-03-04 | Disposition: A | Discharge status: Home / Self Care | Payer: 59 | Source: Ambulatory Visit | Attending: Obstetrics and Gynecology | Admitting: Obstetrics and Gynecology

## 2012-03-04 DIAGNOSIS — O923 Agalactia: Secondary | ICD-10-CM | POA: Insufficient documentation

## 2012-04-04 ENCOUNTER — Encounter (HOSPITAL_COMMUNITY)
Admission: RE | Admit: 2012-04-04 | Discharge: 2012-04-04 | Disposition: A | Discharge status: Home / Self Care | Payer: 59 | Source: Ambulatory Visit | Attending: Obstetrics and Gynecology | Admitting: Obstetrics and Gynecology

## 2012-04-04 DIAGNOSIS — O923 Agalactia: Secondary | ICD-10-CM | POA: Insufficient documentation

## 2012-05-05 ENCOUNTER — Encounter (HOSPITAL_COMMUNITY)
Admission: RE | Admit: 2012-05-05 | Discharge: 2012-05-05 | Disposition: A | Discharge status: Home / Self Care | Payer: 59 | Source: Ambulatory Visit | Attending: Obstetrics and Gynecology | Admitting: Obstetrics and Gynecology

## 2012-05-05 DIAGNOSIS — O923 Agalactia: Secondary | ICD-10-CM | POA: Insufficient documentation

## 2012-05-17 ENCOUNTER — Other Ambulatory Visit (HOSPITAL_COMMUNITY)
Admission: RE | Admit: 2012-05-17 | Discharge: 2012-05-17 | Disposition: A | Discharge status: Home / Self Care | Payer: 59 | Source: Ambulatory Visit | Attending: Obstetrics and Gynecology | Admitting: Obstetrics and Gynecology

## 2012-05-17 ENCOUNTER — Other Ambulatory Visit: Payer: Self-pay | Admitting: Obstetrics and Gynecology

## 2012-05-17 DIAGNOSIS — Z01419 Encounter for gynecological examination (general) (routine) without abnormal findings: Secondary | ICD-10-CM | POA: Insufficient documentation

## 2012-05-17 DIAGNOSIS — Z1151 Encounter for screening for human papillomavirus (HPV): Secondary | ICD-10-CM | POA: Insufficient documentation

## 2012-05-28 ENCOUNTER — Encounter (HOSPITAL_COMMUNITY): Admission: RE | Admit: 2012-05-28 | Payer: 59 | Source: Ambulatory Visit

## 2012-06-03 ENCOUNTER — Encounter (HOSPITAL_COMMUNITY)
Admission: RE | Admit: 2012-06-03 | Discharge: 2012-06-03 | Disposition: A | Discharge status: Home / Self Care | Payer: 59 | Source: Ambulatory Visit | Attending: Obstetrics and Gynecology | Admitting: Obstetrics and Gynecology

## 2012-06-03 DIAGNOSIS — O923 Agalactia: Secondary | ICD-10-CM | POA: Insufficient documentation

## 2012-07-04 ENCOUNTER — Encounter (HOSPITAL_COMMUNITY)
Admission: RE | Admit: 2012-07-04 | Discharge: 2012-07-04 | Disposition: A | Discharge status: Home / Self Care | Payer: 59 | Source: Ambulatory Visit | Attending: Obstetrics and Gynecology | Admitting: Obstetrics and Gynecology

## 2012-07-04 DIAGNOSIS — O923 Agalactia: Secondary | ICD-10-CM | POA: Insufficient documentation

## 2012-08-04 ENCOUNTER — Encounter (HOSPITAL_COMMUNITY): Payer: 59

## 2012-08-04 ENCOUNTER — Encounter (HOSPITAL_COMMUNITY)
Admission: RE | Admit: 2012-08-04 | Discharge: 2012-08-04 | Disposition: A | Discharge status: Home / Self Care | Payer: 59 | Source: Ambulatory Visit | Attending: Obstetrics and Gynecology | Admitting: Obstetrics and Gynecology

## 2012-08-04 DIAGNOSIS — O923 Agalactia: Secondary | ICD-10-CM | POA: Insufficient documentation

## 2013-10-24 ENCOUNTER — Other Ambulatory Visit: Payer: Self-pay

## 2013-10-24 DIAGNOSIS — Z1231 Encounter for screening mammogram for malignant neoplasm of breast: Secondary | ICD-10-CM

## 2013-10-31 ENCOUNTER — Encounter (INDEPENDENT_AMBULATORY_CARE_PROVIDER_SITE_OTHER): Payer: Self-pay

## 2013-10-31 ENCOUNTER — Ambulatory Visit
Admission: RE | Admit: 2013-10-31 | Discharge: 2013-10-31 | Disposition: A | Discharge status: Home / Self Care | Payer: 59 | Source: Ambulatory Visit

## 2013-10-31 DIAGNOSIS — Z1231 Encounter for screening mammogram for malignant neoplasm of breast: Secondary | ICD-10-CM

## 2014-01-09 ENCOUNTER — Encounter (HOSPITAL_COMMUNITY): Payer: Self-pay | Admitting: *Deleted

## 2015-01-09 ENCOUNTER — Other Ambulatory Visit: Payer: Self-pay

## 2015-01-09 DIAGNOSIS — Z1231 Encounter for screening mammogram for malignant neoplasm of breast: Secondary | ICD-10-CM

## 2015-02-12 ENCOUNTER — Ambulatory Visit: Payer: Self-pay

## 2015-03-26 ENCOUNTER — Ambulatory Visit
Admission: RE | Admit: 2015-03-26 | Discharge: 2015-03-26 | Disposition: A | Discharge status: Home / Self Care | Payer: 59 | Source: Ambulatory Visit

## 2015-03-26 DIAGNOSIS — Z1231 Encounter for screening mammogram for malignant neoplasm of breast: Secondary | ICD-10-CM

## 2015-07-12 ENCOUNTER — Other Ambulatory Visit (HOSPITAL_COMMUNITY)
Admission: RE | Admit: 2015-07-12 | Discharge: 2015-07-12 | Disposition: A | Discharge status: Home / Self Care | Payer: 59 | Source: Ambulatory Visit | Attending: Obstetrics and Gynecology | Admitting: Obstetrics and Gynecology

## 2015-07-12 ENCOUNTER — Other Ambulatory Visit: Payer: Self-pay | Admitting: Obstetrics and Gynecology

## 2015-07-12 DIAGNOSIS — Z01411 Encounter for gynecological examination (general) (routine) with abnormal findings: Secondary | ICD-10-CM | POA: Insufficient documentation

## 2015-07-12 DIAGNOSIS — Z1151 Encounter for screening for human papillomavirus (HPV): Secondary | ICD-10-CM | POA: Diagnosis present

## 2015-07-13 LAB — CYTOLOGY - PAP

## 2016-06-17 ENCOUNTER — Other Ambulatory Visit: Payer: Self-pay | Admitting: Family Medicine

## 2016-06-17 DIAGNOSIS — Z1231 Encounter for screening mammogram for malignant neoplasm of breast: Secondary | ICD-10-CM

## 2016-07-07 ENCOUNTER — Ambulatory Visit
Admission: RE | Admit: 2016-07-07 | Discharge: 2016-07-07 | Disposition: A | Discharge status: Home / Self Care | Payer: 59 | Source: Ambulatory Visit | Attending: Family Medicine | Admitting: Family Medicine

## 2016-07-07 DIAGNOSIS — Z1231 Encounter for screening mammogram for malignant neoplasm of breast: Secondary | ICD-10-CM

## 2017-03-02 ENCOUNTER — Emergency Department (HOSPITAL_BASED_OUTPATIENT_CLINIC_OR_DEPARTMENT_OTHER): Admit: 2017-03-02 | Discharge: 2017-03-02 | Disposition: A | Discharge status: Home / Self Care | Payer: 59

## 2017-03-02 ENCOUNTER — Emergency Department (HOSPITAL_COMMUNITY)
Admission: EM | Admit: 2017-03-02 | Discharge: 2017-03-02 | Disposition: A | Discharge status: Home / Self Care | Payer: 59 | Attending: Emergency Medicine | Admitting: Emergency Medicine

## 2017-03-02 ENCOUNTER — Other Ambulatory Visit: Payer: Self-pay

## 2017-03-02 ENCOUNTER — Emergency Department (HOSPITAL_COMMUNITY): Payer: 59

## 2017-03-02 ENCOUNTER — Encounter (HOSPITAL_COMMUNITY): Payer: Self-pay | Admitting: *Deleted

## 2017-03-02 DIAGNOSIS — Z79899 Other long term (current) drug therapy: Secondary | ICD-10-CM | POA: Insufficient documentation

## 2017-03-02 DIAGNOSIS — M25551 Pain in right hip: Secondary | ICD-10-CM

## 2017-03-02 DIAGNOSIS — M79609 Pain in unspecified limb: Secondary | ICD-10-CM | POA: Diagnosis not present

## 2017-03-02 MED ORDER — CYCLOBENZAPRINE HCL 10 MG PO TABS: 10.0000 mg | ORAL_TABLET | Freq: Two times a day (BID) | ORAL | 0 refills | Status: AC | PRN

## 2017-03-02 MED ORDER — CYCLOBENZAPRINE HCL 10 MG PO TABS
5.0000 mg | ORAL_TABLET | Freq: Once | ORAL | Status: AC
  Administered 2017-03-02: 5 mg via ORAL
  Filled 2017-03-02: qty 1

## 2017-03-02 NOTE — ED Triage Notes (Signed)
Pt in c/o R sided Hip pain onset today with pain radiating to R leg, pt denies injury, pt denies swelling,pt states, "I cannot walk because of the pain." pt denies bowel and bladder incontinence, A&O x4

## 2017-03-02 NOTE — ED Notes (Signed)
Patient transported to X-ray 

## 2017-03-02 NOTE — ED Provider Notes (Signed)
[facility]   CSN: [id number] Arrival date & time: 03/02/17  1519     History   Chief Complaint Chief Complaint  Patient presents with  . Leg Pain  . Hip Pain    HPI Andrea Patel is a 41 y.o. female no significant past medical history presents today with chief complaint acute onset, progressively worsening right hip pain which began earlier today.  Patient states that when she awoke this morning she felt that her hip "was catching ", and she began developing pain subsequently thereafter.  She states that pain became severe as the day went on, with some improvement after taking ibuprofen.  She notes throbbing pain while sitting, which worsens with changing positions and ambulation.  She states "I can hardly even walk now without assistance due to the pain ".  She also notes numbness to the anterior thigh extending to the knee.  She denies any recent trauma, falls, or bending, lifting, or twisting injury.  She denies numbness.  She denies back pain, bowel or bladder incontinence, fevers, chills, saddle anesthesia, or IV drug use.  She has no prior history of cancer.  No recent travel or surgeries, no hemoptysis or shortness of breath, no prior history of DVT or PE, and she is not on oral contraceptives or estrogen therapy.  The history is provided by the patient.    Past Medical History:  Diagnosis Date  . No pertinent past medical history     There are no active problems to display for this patient.   Past Surgical History:  Procedure Laterality Date  . CESAREAN SECTION  12/31/2011   Procedure: CESAREAN SECTION;  Surgeon: Dorien Chihuahuaara J. Richardson Doppole, MD;  Location: [facility];  Service: Obstetrics;  Laterality: N/A;  . MYOMECTOMY ABDOMINAL APPROACH  04/2004    OB History    Gravida Para Term Preterm AB Living   4 3   3   2    SAB TAB Ectopic Multiple Live Births           2       Home Medications    Prior to Admission medications    Medication Sig Start Date End Date Taking? Authorizing Provider  ibuprofen (ADVIL,MOTRIN) 200 MG tablet Take 800 mg by mouth every 6 (six) hours as needed for mild pain.   Yes [provider]  Multiple Vitamin (MULTIVITAMIN WITH MINERALS) TABS tablet Take 1 tablet by mouth daily.   Yes [provider]  cyclobenzaprine (FLEXERIL) 10 MG tablet Take 1 tablet (10 mg total) by mouth 2 (two) times daily as needed for muscle spasms. 03/02/17   Jeanie SewerFawze, Yessica Putnam A, PA-C    Family History Family History  Problem Relation Age of Onset  . Other Neg Hx     Social History Social History   Tobacco Use  . Smoking status: Never Smoker  . Smokeless tobacco: Never Used  Substance Use Topics  . Alcohol use: No  . Drug use: No     Allergies   Shellfish allergy   Review of Systems Review of Systems  Constitutional: Negative for chills and fever.  Musculoskeletal: Positive for arthralgias (Right hip) and gait problem. Negative for back pain, joint swelling and neck pain.  Neurological: Positive for numbness. Negative for syncope and weakness.     Physical Exam Updated Vital Signs BP 110/78 (BP Location: Right Arm)   Pulse 74   Temp 98.9 F (37.2 C) (Oral)   Resp 20   Ht  5\' 5"  (1.651 m)   Wt 74.8 kg (165 lb)   LMP 02/16/2017 (Approximate)   SpO2 99%   Breastfeeding? No   BMI 27.46 kg/m   Physical Exam  Constitutional: She appears well-developed and well-nourished. No distress.  HENT:  Head: Normocephalic and atraumatic.  Eyes: Conjunctivae are normal. Right eye exhibits no discharge. Left eye exhibits no discharge.  Neck: Normal range of motion. Neck supple. No JVD present. No tracheal deviation present.  Cardiovascular: Normal rate and intact distal pulses.  2+DP/PT pulses bl, negative Homan's bl, no lower extremity edema  Pulmonary/Chest: Effort normal and breath sounds normal.  Abdominal: Soft. Bowel sounds are normal. She exhibits no distension. There is no  tenderness.  Musculoskeletal: She exhibits tenderness. She exhibits no edema.  Severely decreased active and passive range of motion of the right hip secondary to pain, worst with internal rotation.  Mild tenderness to palpation anteriorly inferior to the ASIS.  No erythema, crepitus, or deformity noted.  5/5 strength of BLE major muscle groups, however with more difficulty in the left lower extremity. No midline spine TTP, no paraspinal muscle tenderness, no deformity, crepitus, or step-off noted.  Negative straight leg raise.  Neurological: She is alert.  Fluent speech, no facial droop, sensation intact to soft touch of bilateral lower extremity, antalgic gait, and patient is unable to ambulate without the assistance of myself or another person.  She has difficulty with Heel Walk and Toe Walk secondary to pain.  Skin: Skin is warm and dry. No erythema.  Psychiatric: She has a normal mood and affect. Her behavior is normal.  Nursing note and vitals reviewed.    ED Treatments / Results  Labs (all labs ordered are listed, but only abnormal results are displayed) Labs Reviewed - No data to display  EKG  EKG Interpretation None       Radiology Dg Hip Unilat With Pelvis 2-3 Views Right  Result Date: 03/02/2017 CLINICAL DATA:  Right hip pain beginning today with radiation down right leg. No injury. EXAM: DG HIP (WITH OR WITHOUT PELVIS) 2-3V RIGHT COMPARISON:  None. FINDINGS: There is no evidence of hip fracture or dislocation. There is no evidence of arthropathy or other focal bone abnormality. IMPRESSION: Negative. Electronically Signed   By: Elberta Fortisaniel  Boyle M.D.   On: 03/02/2017 17:28    Procedures Procedures (including critical care time)  Medications Ordered in ED Medications  cyclobenzaprine (FLEXERIL) tablet 5 mg (5 mg Oral Given 03/02/17 1915)     Initial Impression / Assessment and Plan / ED Course  I have reviewed the triage vital signs and the nursing notes.  Pertinent  labs & imaging results that were available during my care of the patient were reviewed by me and considered in my medical decision making (see chart for details).    Patient with acute onset of worsening right hip pain, improved with ibuprofen.  Afebrile, vital signs are stable (initially mildly hypertensive with improvement while in the ED).  She is ambulatory, however it is very painful.  She has decreased range of motion secondary to pain.  Radiographs without acute abnormality.  I doubt septic joint in the absence of constitutional symptoms, erythema, or swelling.  No evidence of gout or ostiomyelitis.  DVT study negative for DVT of the right lower extremity.  Pain has been managed while in the ED.  Suspect musculoskeletal etiology to her symptoms.  Will discharge with Flexeril and advised of appropriate use of this medication. RICE therapy indicated and discussed  with patient.  She will use a walker at home to avoid weightbearing.  She will follow-up with her PCP or orthopedist for reevaluation of her symptoms on an outpatient basis.  Discussed indications for return to the ED. Pt verbalized understanding of and agreement with plan and is safe for discharge home at this time.  She has no complaints prior to discharge.  Final Clinical Impressions(s) / ED Diagnoses   Final diagnoses:  Right hip pain    ED Discharge Orders        Ordered    cyclobenzaprine (FLEXERIL) 10 MG tablet  2 times daily PRN     03/02/17 1900       Jeanie Sewer, PA-C 03/02/17 2043    Charlynne Pander, MD 03/05/17 [phone]

## 2017-03-02 NOTE — ED Notes (Signed)
Pt staets pain has improved but took 800 mg ibuprofent PTA at 1430

## 2017-03-02 NOTE — Progress Notes (Signed)
Right lower extremity venous duplex has been completed. Negative for DVT. [facility].  03/02/17 6:24 PM Olen CordialGreg Aya Geisel RVT

## 2017-03-02 NOTE — ED Notes (Signed)
[place]. Home stable via wc with friend.

## 2017-03-02 NOTE — Discharge Instructions (Signed)
1. Medications: Alternate 600 mg of ibuprofen and [phone] mg of Tylenol every 3 hours as needed for pain. Do not exceed 4000 mg of Tylenol daily, Flexeril up to twice a day as needed for muscle spasm.  No driving, drinking alcohol, or operating heavy machinery on this medication as it may make you drowsy.  You may also cut these tablets in half.  Usual home medications 2. Treatment: rest, ice, elevate and use crutches or walker to avoid bearing weight, drink plenty of fluids, gentle stretching 3. Follow Up: Please followup with orthopedics as directed or your PCP in 1 week if no improvement for discussion of your diagnoses and further evaluation after today's visit; if you do not have a primary care doctor use the resource guide provided to find one; Please return to the ER for worsening symptoms or other concerns such as fever, weakness, back pain, or loss of control of bowel or bladder.

## 2017-08-04 ENCOUNTER — Ambulatory Visit
Admission: RE | Admit: 2017-08-04 | Discharge: 2017-08-04 | Disposition: A | Discharge status: Home / Self Care | Payer: 59 | Source: Ambulatory Visit | Attending: Family Medicine | Admitting: Family Medicine

## 2017-08-04 ENCOUNTER — Other Ambulatory Visit: Payer: Self-pay | Admitting: Family Medicine

## 2017-08-04 DIAGNOSIS — Z1231 Encounter for screening mammogram for malignant neoplasm of breast: Secondary | ICD-10-CM

## 2018-09-09 ENCOUNTER — Other Ambulatory Visit: Payer: Self-pay | Admitting: Family Medicine

## 2018-09-09 DIAGNOSIS — Z1231 Encounter for screening mammogram for malignant neoplasm of breast: Secondary | ICD-10-CM

## 2018-09-23 ENCOUNTER — Other Ambulatory Visit: Payer: Self-pay | Admitting: Obstetrics and Gynecology

## 2018-09-23 ENCOUNTER — Other Ambulatory Visit (HOSPITAL_COMMUNITY)
Admission: RE | Admit: 2018-09-23 | Discharge: 2018-09-23 | Disposition: A | Discharge status: Home / Self Care | Payer: 59 | Source: Ambulatory Visit | Attending: Obstetrics and Gynecology | Admitting: Obstetrics and Gynecology

## 2018-09-23 DIAGNOSIS — Z01419 Encounter for gynecological examination (general) (routine) without abnormal findings: Secondary | ICD-10-CM | POA: Diagnosis not present

## 2018-09-29 LAB — CYTOLOGY - PAP
Diagnosis: NEGATIVE
HPV: NOT DETECTED

## 2018-10-21 ENCOUNTER — Ambulatory Visit
Admission: RE | Admit: 2018-10-21 | Discharge: 2018-10-21 | Disposition: A | Discharge status: Home / Self Care | Payer: 59 | Source: Ambulatory Visit | Attending: Family Medicine | Admitting: Family Medicine

## 2018-10-21 ENCOUNTER — Other Ambulatory Visit: Payer: Self-pay

## 2018-10-21 DIAGNOSIS — Z1231 Encounter for screening mammogram for malignant neoplasm of breast: Secondary | ICD-10-CM

## 2019-01-13 ENCOUNTER — Other Ambulatory Visit: Payer: Self-pay

## 2019-01-13 DIAGNOSIS — Z20822 Contact with and (suspected) exposure to covid-19: Secondary | ICD-10-CM

## 2019-01-14 LAB — NOVEL CORONAVIRUS, NAA: SARS-CoV-2, NAA: NOT DETECTED

## 2019-10-11 ENCOUNTER — Other Ambulatory Visit: Payer: Self-pay | Admitting: Family Medicine

## 2019-10-11 DIAGNOSIS — Z1231 Encounter for screening mammogram for malignant neoplasm of breast: Secondary | ICD-10-CM

## 2019-10-31 ENCOUNTER — Other Ambulatory Visit: Payer: Self-pay

## 2019-10-31 ENCOUNTER — Ambulatory Visit
Admission: RE | Admit: 2019-10-31 | Discharge: 2019-10-31 | Disposition: A | Discharge status: Home / Self Care | Payer: 59 | Source: Ambulatory Visit | Attending: Family Medicine | Admitting: Family Medicine

## 2019-10-31 DIAGNOSIS — Z1231 Encounter for screening mammogram for malignant neoplasm of breast: Secondary | ICD-10-CM

## 2020-12-11 ENCOUNTER — Other Ambulatory Visit: Payer: Self-pay | Admitting: Family Medicine

## 2020-12-11 DIAGNOSIS — Z1231 Encounter for screening mammogram for malignant neoplasm of breast: Secondary | ICD-10-CM

## 2021-01-09 ENCOUNTER — Ambulatory Visit
Admission: RE | Admit: 2021-01-09 | Discharge: 2021-01-09 | Disposition: A | Discharge status: Home / Self Care | Payer: 59 | Source: Ambulatory Visit | Attending: Family Medicine | Admitting: Family Medicine

## 2021-01-09 ENCOUNTER — Other Ambulatory Visit: Payer: Self-pay

## 2021-01-09 DIAGNOSIS — Z1231 Encounter for screening mammogram for malignant neoplasm of breast: Secondary | ICD-10-CM

## 2021-11-20 DIAGNOSIS — N939 Abnormal uterine and vaginal bleeding, unspecified: Secondary | ICD-10-CM | POA: Diagnosis not present

## 2021-11-20 DIAGNOSIS — Z01419 Encounter for gynecological examination (general) (routine) without abnormal findings: Secondary | ICD-10-CM | POA: Diagnosis not present

## 2021-11-28 ENCOUNTER — Other Ambulatory Visit: Payer: Self-pay

## 2021-11-28 DIAGNOSIS — Z3202 Encounter for pregnancy test, result negative: Secondary | ICD-10-CM | POA: Diagnosis not present

## 2021-11-28 DIAGNOSIS — N939 Abnormal uterine and vaginal bleeding, unspecified: Secondary | ICD-10-CM | POA: Diagnosis not present

## 2021-11-29 DIAGNOSIS — Z1212 Encounter for screening for malignant neoplasm of rectum: Secondary | ICD-10-CM | POA: Diagnosis not present

## 2021-11-29 DIAGNOSIS — Z79899 Other long term (current) drug therapy: Secondary | ICD-10-CM | POA: Diagnosis not present

## 2021-11-29 DIAGNOSIS — Z1211 Encounter for screening for malignant neoplasm of colon: Secondary | ICD-10-CM | POA: Diagnosis not present

## 2021-11-29 DIAGNOSIS — D125 Benign neoplasm of sigmoid colon: Secondary | ICD-10-CM | POA: Diagnosis not present

## 2021-11-29 DIAGNOSIS — D122 Benign neoplasm of ascending colon: Secondary | ICD-10-CM | POA: Diagnosis not present

## 2021-12-02 DIAGNOSIS — R109 Unspecified abdominal pain: Secondary | ICD-10-CM | POA: Diagnosis not present

## 2021-12-13 DIAGNOSIS — K8020 Calculus of gallbladder without cholecystitis without obstruction: Secondary | ICD-10-CM | POA: Diagnosis not present

## 2021-12-13 DIAGNOSIS — R1011 Right upper quadrant pain: Secondary | ICD-10-CM | POA: Diagnosis not present

## 2021-12-13 DIAGNOSIS — Z9889 Other specified postprocedural states: Secondary | ICD-10-CM | POA: Diagnosis not present

## 2021-12-13 DIAGNOSIS — K810 Acute cholecystitis: Secondary | ICD-10-CM | POA: Diagnosis not present

## 2021-12-13 DIAGNOSIS — K8000 Calculus of gallbladder with acute cholecystitis without obstruction: Secondary | ICD-10-CM | POA: Diagnosis not present

## 2021-12-13 DIAGNOSIS — R110 Nausea: Secondary | ICD-10-CM | POA: Diagnosis not present

## 2021-12-13 DIAGNOSIS — K8010 Calculus of gallbladder with chronic cholecystitis without obstruction: Secondary | ICD-10-CM | POA: Diagnosis not present

## 2021-12-13 DIAGNOSIS — K8012 Calculus of gallbladder with acute and chronic cholecystitis without obstruction: Secondary | ICD-10-CM | POA: Diagnosis not present

## 2021-12-13 DIAGNOSIS — Z79899 Other long term (current) drug therapy: Secondary | ICD-10-CM | POA: Diagnosis not present

## 2021-12-30 ENCOUNTER — Other Ambulatory Visit: Payer: Self-pay | Admitting: Obstetrics and Gynecology

## 2021-12-30 DIAGNOSIS — Z1231 Encounter for screening mammogram for malignant neoplasm of breast: Secondary | ICD-10-CM

## 2022-01-23 ENCOUNTER — Ambulatory Visit: Payer: 59

## 2022-01-23 ENCOUNTER — Ambulatory Visit
Admission: RE | Admit: 2022-01-23 | Discharge: 2022-01-23 | Disposition: A | Discharge status: Home / Self Care | Payer: BC Managed Care – PPO | Source: Ambulatory Visit | Attending: Obstetrics and Gynecology | Admitting: Obstetrics and Gynecology

## 2022-01-23 DIAGNOSIS — Z1231 Encounter for screening mammogram for malignant neoplasm of breast: Secondary | ICD-10-CM

## 2022-05-12 DIAGNOSIS — M7732 Calcaneal spur, left foot: Secondary | ICD-10-CM | POA: Diagnosis not present

## 2022-05-12 DIAGNOSIS — M79672 Pain in left foot: Secondary | ICD-10-CM | POA: Diagnosis not present

## 2022-06-19 DIAGNOSIS — M79672 Pain in left foot: Secondary | ICD-10-CM | POA: Diagnosis not present

## 2022-06-19 DIAGNOSIS — L309 Dermatitis, unspecified: Secondary | ICD-10-CM | POA: Diagnosis not present

## 2022-06-19 DIAGNOSIS — Z0000 Encounter for general adult medical examination without abnormal findings: Secondary | ICD-10-CM | POA: Diagnosis not present

## 2022-06-19 DIAGNOSIS — Z1331 Encounter for screening for depression: Secondary | ICD-10-CM | POA: Diagnosis not present

## 2022-08-29 IMAGING — MG MM DIGITAL SCREENING BILAT W/ TOMO AND CAD
8 series · 8 of 24 positions shown · non-contrast
Comparison: Previous exam(s).

CLINICAL DATA: Screening.

EXAM:
DIGITAL SCREENING BILATERAL MAMMOGRAM WITH TOMOSYNTHESIS AND CAD
TECHNIQUE: Bilateral screening digital craniocaudal and mediolateral oblique
mammograms were obtained. Bilateral screening digital breast
tomosynthesis was performed. The images were evaluated with
computer-aided detection.

[L MLO synth-2D]
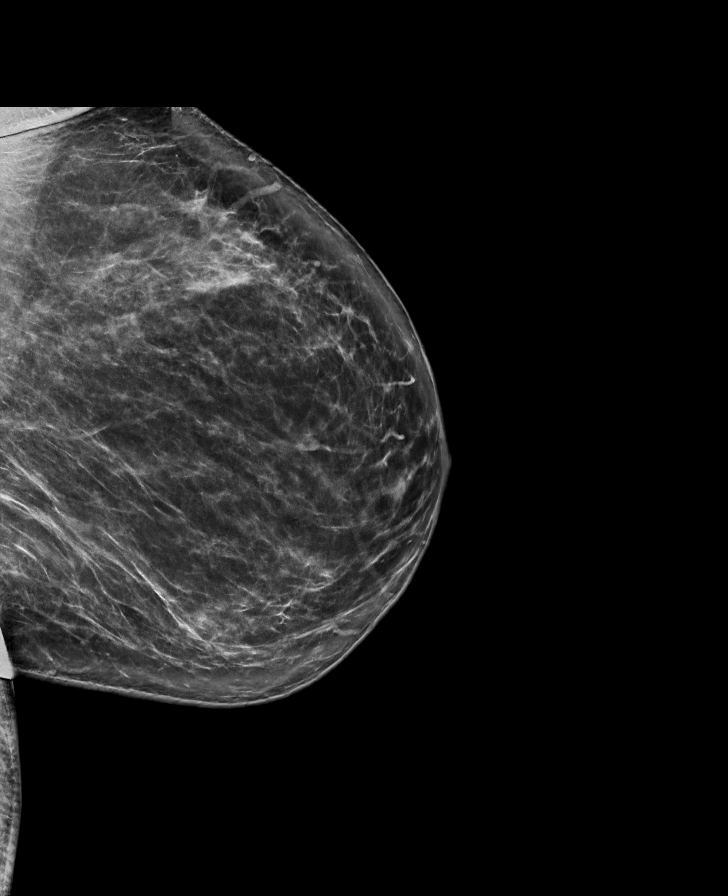

[R CC synth-2D]
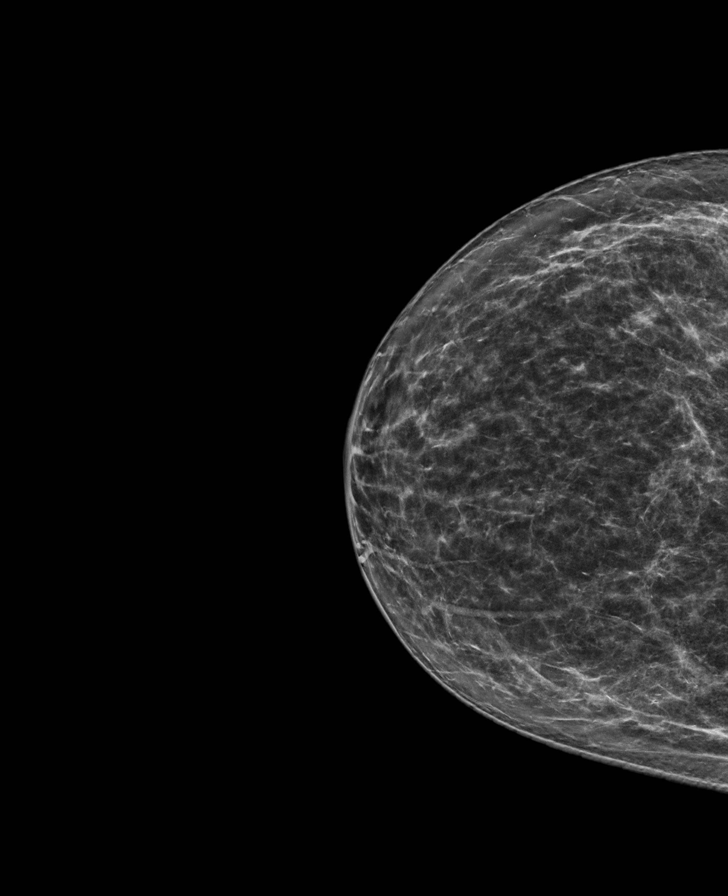

[L CC synth-2D]
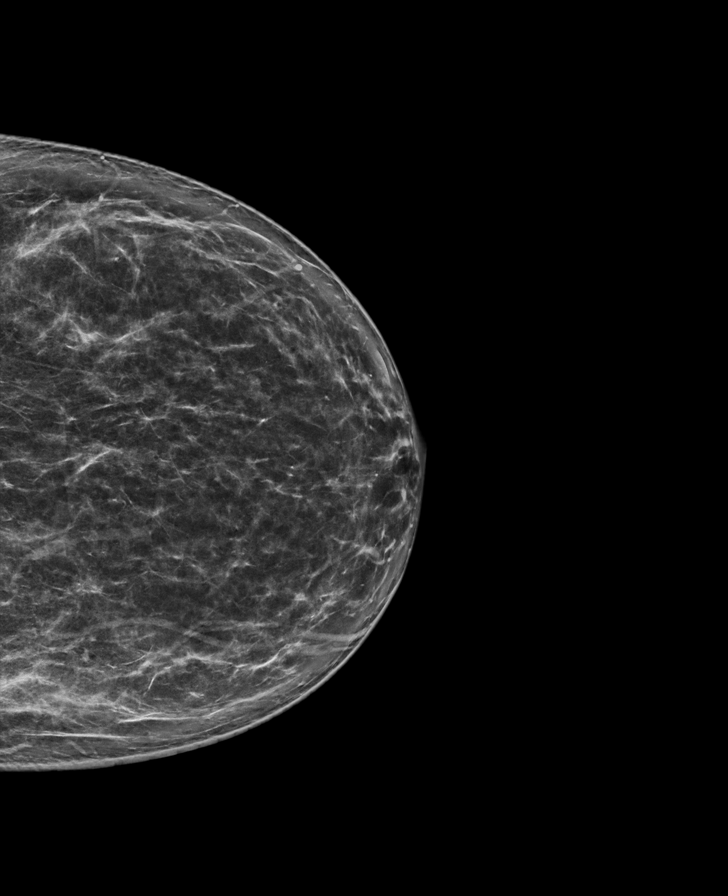

[R MLO synth-2D]
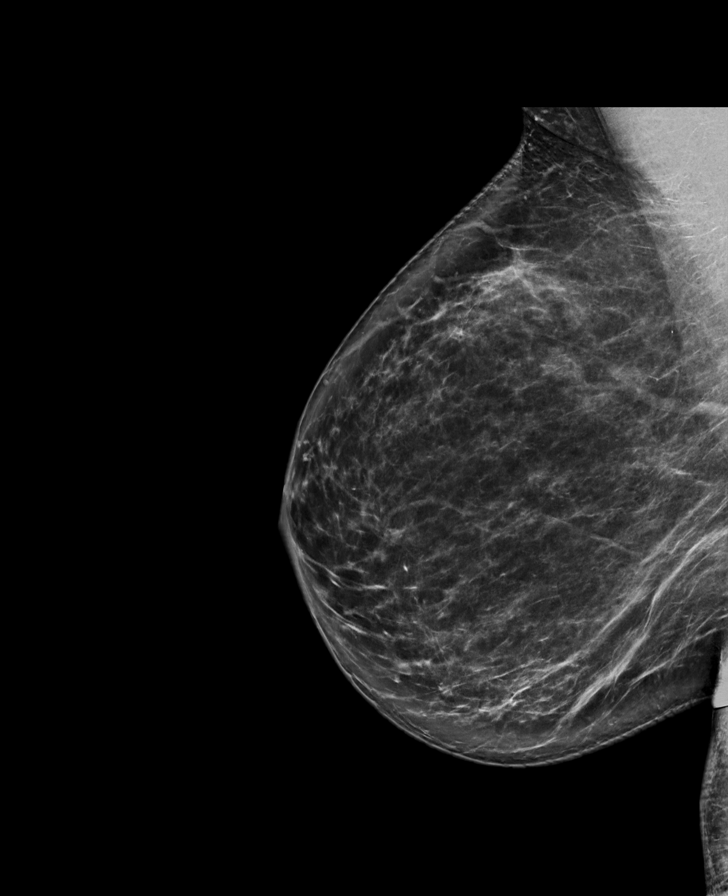

[R MLO tomo · tomo slice 48/95.0]
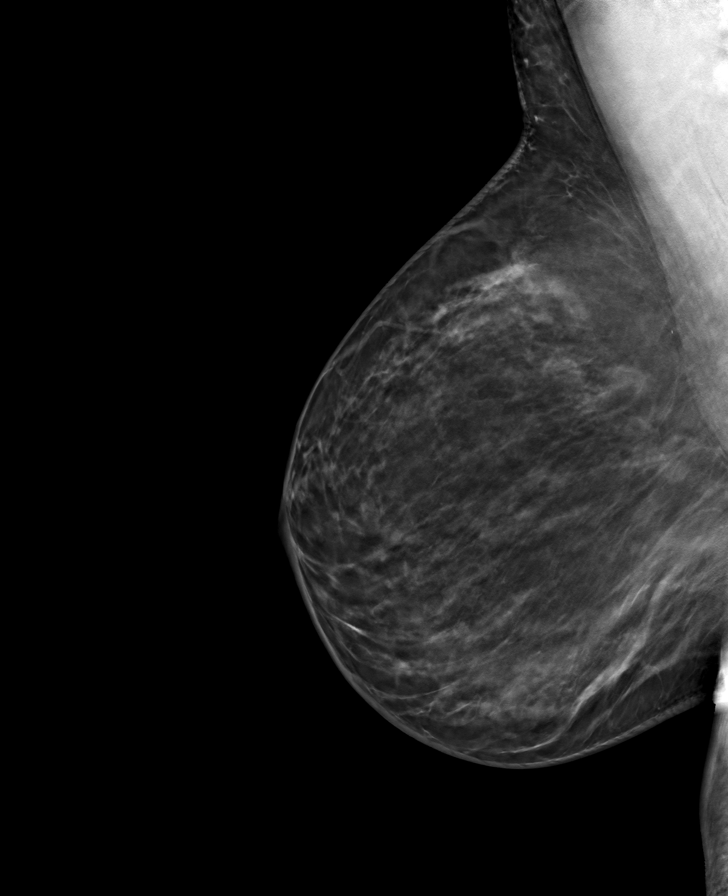

[L CC tomo · tomo slice 45/89.0]
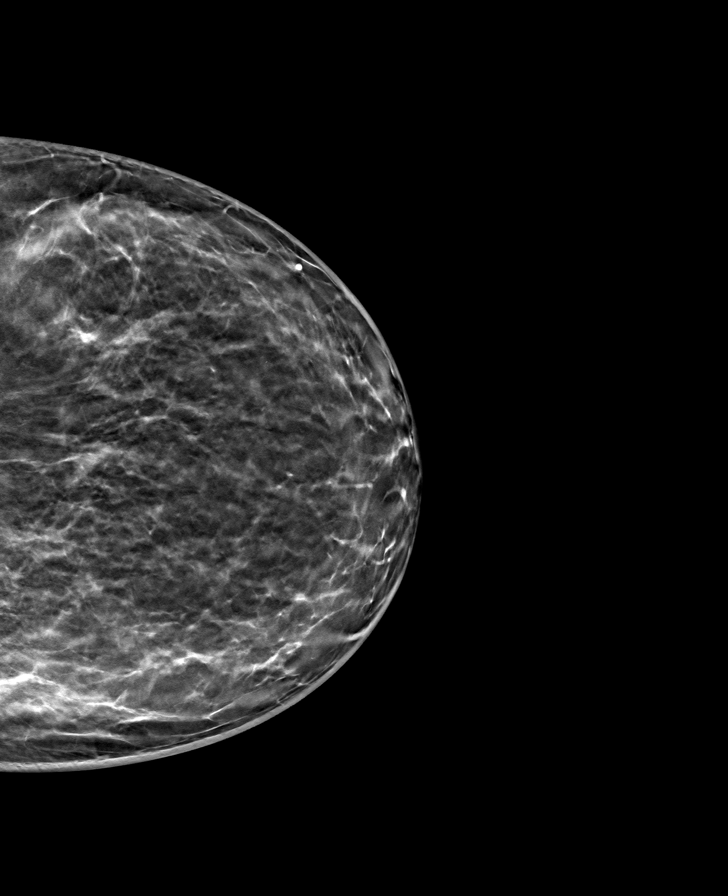

[R CC tomo · tomo slice 44/87.0]
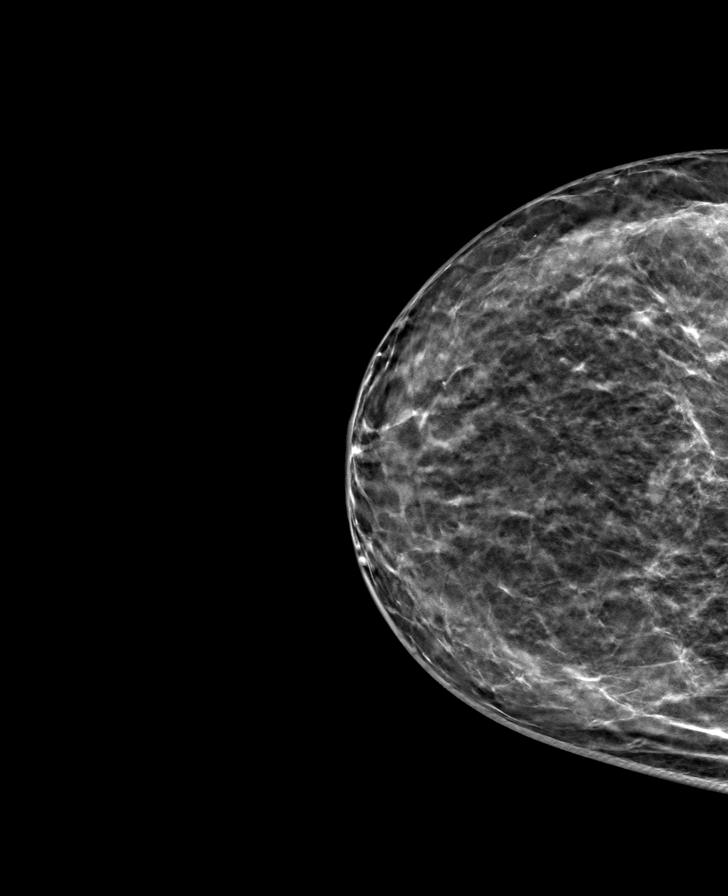

[L MLO tomo · tomo slice 47/93.0]
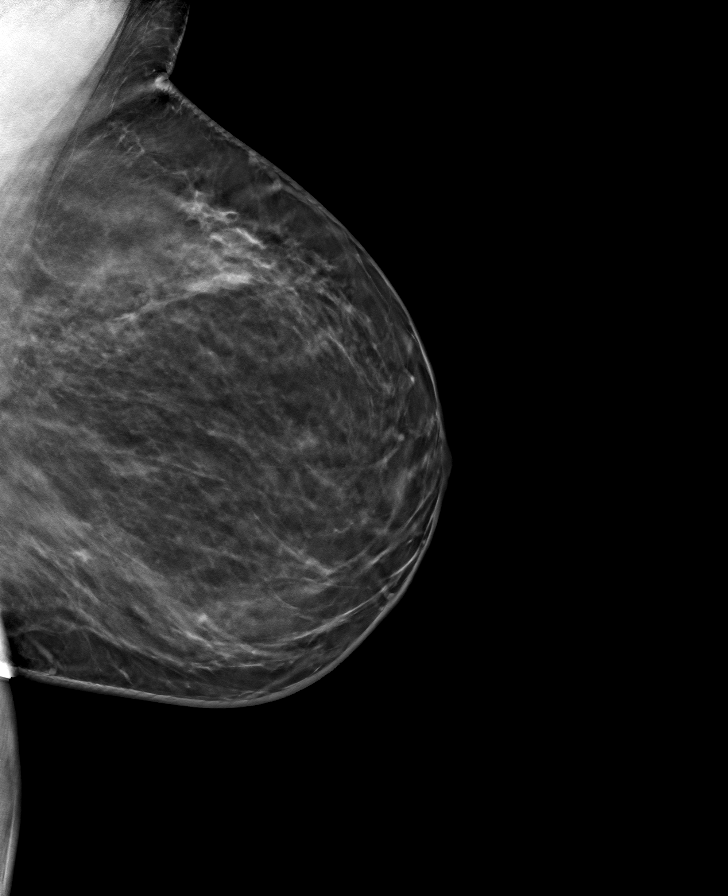

[8 of 24 positions shown; findings below may reference images not displayed]

ACR Breast Density Category b: There are scattered areas of
fibroglandular density.
FINDINGS: There are no findings suspicious for malignancy.
IMPRESSION: No mammographic evidence of malignancy. A result letter of this
screening mammogram will be mailed directly to the patient.

RECOMMENDATION:
Screening mammogram in one year. (Code:51-O-LD2)

BI-RADS CATEGORY  1: Negative.

## 2022-11-26 DIAGNOSIS — Z23 Encounter for immunization: Secondary | ICD-10-CM | POA: Diagnosis not present

## 2022-11-26 DIAGNOSIS — Z9049 Acquired absence of other specified parts of digestive tract: Secondary | ICD-10-CM | POA: Diagnosis not present

## 2022-11-26 DIAGNOSIS — R55 Syncope and collapse: Secondary | ICD-10-CM | POA: Diagnosis not present

## 2022-11-26 DIAGNOSIS — R112 Nausea with vomiting, unspecified: Secondary | ICD-10-CM | POA: Diagnosis not present

## 2022-11-26 DIAGNOSIS — K529 Noninfective gastroenteritis and colitis, unspecified: Secondary | ICD-10-CM | POA: Diagnosis not present

## 2023-02-19 ENCOUNTER — Other Ambulatory Visit: Payer: Self-pay | Admitting: Obstetrics and Gynecology

## 2023-02-19 DIAGNOSIS — Z1231 Encounter for screening mammogram for malignant neoplasm of breast: Secondary | ICD-10-CM

## 2023-02-26 ENCOUNTER — Ambulatory Visit
Admission: RE | Admit: 2023-02-26 | Discharge: 2023-02-26 | Disposition: A | Discharge status: Home / Self Care | Payer: BC Managed Care – PPO | Source: Ambulatory Visit | Attending: Obstetrics and Gynecology | Admitting: Obstetrics and Gynecology

## 2023-02-26 DIAGNOSIS — Z1231 Encounter for screening mammogram for malignant neoplasm of breast: Secondary | ICD-10-CM

## 2024-01-25 ENCOUNTER — Other Ambulatory Visit: Payer: Self-pay | Admitting: Obstetrics and Gynecology

## 2024-01-25 DIAGNOSIS — Z1231 Encounter for screening mammogram for malignant neoplasm of breast: Secondary | ICD-10-CM

## 2024-02-29 ENCOUNTER — Inpatient Hospital Stay: Admission: RE | Admit: 2024-02-29 | Discharge: 2024-02-29 | Attending: Obstetrics and Gynecology

## 2024-02-29 DIAGNOSIS — Z1231 Encounter for screening mammogram for malignant neoplasm of breast: Secondary | ICD-10-CM
